# Patient Record
Sex: Female | Born: 2000 | Race: White | Hispanic: No | Marital: Single | Smoking: Never smoker
Health system: Southern US, Community
[De-identification: ages and names within clinical notes are randomized; demographics above are authoritative.]

## PROBLEM LIST (undated history)

## (undated) DIAGNOSIS — F419 Anxiety disorder, unspecified: Secondary | ICD-10-CM

## (undated) DIAGNOSIS — F909 Attention-deficit hyperactivity disorder, unspecified type: Secondary | ICD-10-CM

## (undated) HISTORY — PX: THYROID CYST EXCISION: SHX2511

## (undated) HISTORY — PX: NO PAST SURGERIES: SHX2092

## (undated) HISTORY — DX: Attention-deficit hyperactivity disorder, unspecified type: F90.9

---
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
## 2022-08-26 DIAGNOSIS — Z419 Encounter for procedure for purposes other than remedying health state, unspecified: Secondary | ICD-10-CM | POA: Diagnosis not present

## 2022-09-04 DIAGNOSIS — F411 Generalized anxiety disorder: Secondary | ICD-10-CM | POA: Diagnosis not present

## 2022-09-04 DIAGNOSIS — F418 Other specified anxiety disorders: Secondary | ICD-10-CM | POA: Diagnosis not present

## 2022-09-26 DIAGNOSIS — Z419 Encounter for procedure for purposes other than remedying health state, unspecified: Secondary | ICD-10-CM | POA: Diagnosis not present

## 2022-10-11 ENCOUNTER — Encounter (HOSPITAL_BASED_OUTPATIENT_CLINIC_OR_DEPARTMENT_OTHER): Payer: Self-pay | Admitting: Emergency Medicine

## 2022-10-11 ENCOUNTER — Emergency Department (HOSPITAL_BASED_OUTPATIENT_CLINIC_OR_DEPARTMENT_OTHER)
Admission: EM | Admit: 2022-10-11 | Discharge: 2022-10-11 | Disposition: A | Discharge status: Home / Self Care | Payer: Medicaid Other | Attending: Emergency Medicine | Admitting: Emergency Medicine

## 2022-10-11 ENCOUNTER — Other Ambulatory Visit: Payer: Self-pay

## 2022-10-11 DIAGNOSIS — R112 Nausea with vomiting, unspecified: Secondary | ICD-10-CM | POA: Diagnosis not present

## 2022-10-11 DIAGNOSIS — R197 Diarrhea, unspecified: Secondary | ICD-10-CM | POA: Insufficient documentation

## 2022-10-11 DIAGNOSIS — I951 Orthostatic hypotension: Secondary | ICD-10-CM | POA: Insufficient documentation

## 2022-10-11 LAB — CBC
HCT: 43.6 % (ref 36.0–46.0)
Hemoglobin: 15.2 g/dL — ABNORMAL HIGH (ref 12.0–15.0)
MCH: 31.1 pg (ref 26.0–34.0)
MCHC: 34.9 g/dL (ref 30.0–36.0)
MCV: 89.2 fL (ref 80.0–100.0)
Platelets: 228 10*3/uL (ref 150–400)
RBC: 4.89 MIL/uL (ref 3.87–5.11)
RDW: 12.5 % (ref 11.5–15.5)
WBC: 13.3 10*3/uL — ABNORMAL HIGH (ref 4.0–10.5)
nRBC: 0 % (ref 0.0–0.2)

## 2022-10-11 LAB — COMPREHENSIVE METABOLIC PANEL
ALT: 8 U/L (ref 0–44)
AST: 14 U/L — ABNORMAL LOW (ref 15–41)
Albumin: 5 g/dL (ref 3.5–5.0)
Alkaline Phosphatase: 50 U/L (ref 38–126)
Anion gap: 11 (ref 5–15)
BUN: 17 mg/dL (ref 6–20)
CO2: 25 mmol/L (ref 22–32)
Calcium: 9.9 mg/dL (ref 8.9–10.3)
Chloride: 102 mmol/L (ref 98–111)
Creatinine, Ser: 0.79 mg/dL (ref 0.44–1.00)
GFR, Estimated: >60 mL/min (ref >60)
Glucose, Bld: 92 mg/dL (ref 70–99)
Potassium: 3.8 mmol/L (ref 3.5–5.1)
Sodium: 138 mmol/L (ref 135–145)
Total Bilirubin: 1.4 mg/dL — ABNORMAL HIGH (ref 0.3–1.2)
Total Protein: 7.4 g/dL (ref 6.5–8.1)

## 2022-10-11 LAB — URINALYSIS, ROUTINE W REFLEX MICROSCOPIC
Bacteria, UA: NONE SEEN
Bilirubin Urine: NEGATIVE
Glucose, UA: NEGATIVE mg/dL
Hgb urine dipstick: NEGATIVE
Ketones, ur: NEGATIVE mg/dL
Leukocytes,Ua: NEGATIVE
Nitrite: NEGATIVE
Protein, ur: 30 mg/dL — AB
Specific Gravity, Urine: 1.034 — ABNORMAL HIGH (ref 1.005–1.030)
pH: 5.5 (ref 5.0–8.0)

## 2022-10-11 LAB — LIPASE, BLOOD: Lipase: 12 U/L (ref 11–51)

## 2022-10-11 LAB — PREGNANCY, URINE: Preg Test, Ur: NEGATIVE

## 2022-10-11 MED ORDER — LACTATED RINGERS IV BOLUS
1000.0000 mL | Freq: Once | INTRAVENOUS | Status: AC
  Administered 2022-10-11: 1000 mL via INTRAVENOUS

## 2022-10-11 MED ORDER — ONDANSETRON 4 MG PO TBDP: 4.0000 mg | ORAL_TABLET | Freq: Three times a day (TID) | ORAL | 0 refills | Status: DC | PRN

## 2022-10-11 MED ORDER — ONDANSETRON HCL 4 MG/2ML IJ SOLN
4.0000 mg | Freq: Once | INTRAMUSCULAR | Status: AC
  Administered 2022-10-11: 4 mg via INTRAVENOUS
  Filled 2022-10-11: qty 2

## 2022-10-11 NOTE — Discharge Instructions (Addendum)
Take the nausea medication as prescribed. Start with a clear liquid diet today and advance slowly as tolerated. Followup with your doctor.

## 2022-10-11 NOTE — ED Notes (Signed)
During orthostatics Pt was getting dizzy and lightheaded while standing.Marland KitchenMarland Kitchen

## 2022-10-11 NOTE — ED Notes (Signed)
No N/V while drinking fluids... Provider notified.

## 2022-10-11 NOTE — ED Provider Notes (Signed)
[facility] Provider Note   CSN: [id number] Arrival date & time: 10/11/22  [id number]     History  Chief Complaint  Patient presents with   Emesis   Diarrhea    Kathryn Bailey is a 22 y.o. female.  Patient with a history of POTS, anxiety, ADHD presenting with vomiting and diarrhea that woke her from sleep at 3 AM.  Believes she has food poisoning.  States she has had multiple episodes of nausea and vomiting as well as diarrhea.  Denies any black or bloody stools or blood in her emesis.  States last normal bowel movement was yesterday.  No fevers.  No travel or sick contacts.  No one else with similar symptoms. Patient states she has passed out 3-4 times today in the setting of her vomiting and diarrhea.  She does have a history of POTS.  Describes feeling lightheaded, nauseous, dizzy with blurry vision while sitting on the toilet.  She did fall to the ground of the toilet twice.  Again passed out while lying on the floor and passed out while lying in her bed.  Denies hitting her head or any injuries from passing out. Denies head, neck, back, chest or abdominal pain.  No pain with urination or blood in the urine.  Denies any possibility of pregnancy.  Still has appendix and gallbladder.  The history is provided by the patient.  Emesis Associated symptoms: diarrhea   Associated symptoms: no abdominal pain, no arthralgias, no cough, no fever, no headaches and no myalgias   Diarrhea Associated symptoms: vomiting   Associated symptoms: no abdominal pain, no arthralgias, no fever, no headaches and no myalgias        Home Medications Prior to Admission medications   Medication Sig Start Date End Date Taking? Authorizing Provider  buPROPion (WELLBUTRIN XL) 150 MG 24 hr tablet  07/17/18   [provider]  methylphenidate 54 MG PO CR tablet Take 54 mg by mouth every morning.    [provider]  sertraline (ZOLOFT) 50 MG tablet Take 50  mg by mouth 2 (two) times daily.     [provider]      Allergies    Patient has no known allergies.    Review of Systems   Review of Systems  Constitutional:  Positive for activity change and appetite change. Negative for fever.  HENT:  Negative for congestion.   Respiratory:  Negative for cough, chest tightness and shortness of breath.   Gastrointestinal:  Positive for diarrhea, nausea and vomiting. Negative for abdominal pain.  Genitourinary:  Negative for dysuria and hematuria.  Musculoskeletal:  Negative for arthralgias and myalgias.  Skin:  Negative for rash.  Neurological:  Positive for dizziness, syncope, weakness and light-headedness. Negative for headaches.   all other systems are negative except as noted in the HPI and PMH.    Physical Exam Updated Vital Signs BP 137/76   Pulse 93   Temp 98.1 F (36.7 C) (Oral)   Resp 14   Wt 56.7 kg   SpO2 96%  Physical Exam Vitals and nursing note reviewed.  Constitutional:      General: She is not in acute distress.    Appearance: She is well-developed.  HENT:     Head: Normocephalic and atraumatic.     Mouth/Throat:     Pharynx: No oropharyngeal exudate.  Eyes:     Conjunctiva/sclera: Conjunctivae normal.     Pupils: Pupils are equal, round, and reactive  to light.  Neck:     Comments: No meningismus. Cardiovascular:     Rate and Rhythm: Normal rate and regular rhythm.     Heart sounds: Normal heart sounds. No murmur heard. Pulmonary:     Effort: Pulmonary effort is normal. No respiratory distress.     Breath sounds: Normal breath sounds.  Abdominal:     Palpations: Abdomen is soft.     Tenderness: There is no abdominal tenderness. There is no guarding or rebound.  Musculoskeletal:        General: No tenderness. Normal range of motion.     Cervical back: Normal range of motion and neck supple.  Skin:    General: Skin is warm.  Neurological:     Mental Status: She is alert and oriented to person, place,  and time.     Cranial Nerves: No cranial nerve deficit.     Motor: No abnormal muscle tone.     Coordination: Coordination normal.     Comments:  5/5 strength throughout. CN 2-12 intact.Equal grip strength.   Psychiatric:        Behavior: Behavior normal.     ED Results / Procedures / Treatments   Labs (all labs ordered are listed, but only abnormal results are displayed) Labs Reviewed  COMPREHENSIVE METABOLIC PANEL - Abnormal; Notable for the following components:      Result Value   AST 14 (*)    Total Bilirubin 1.4 (*)    All other components within normal limits  CBC - Abnormal; Notable for the following components:   WBC 13.3 (*)    Hemoglobin 15.2 (*)    All other components within normal limits  URINALYSIS, ROUTINE W REFLEX MICROSCOPIC - Abnormal; Notable for the following components:   Specific Gravity, Urine 1.034 (*)    Protein, ur 30 (*)    All other components within normal limits  LIPASE, BLOOD  PREGNANCY, URINE    EKG EKG Interpretation  Date/Time:  Friday October 11 2022 06:21:00 EST Ventricular Rate:  103 PR Interval:  115 QRS Duration: 99 QT Interval:  335 QTC Calculation: 439 R Axis:   80 Text Interpretation: Sinus tachycardia RSR' in V1 or V2, right VCD or RVH No significant change was found Confirmed by Ezequiel Essex [phone]) on 10/11/2022 6:24:27 AM  Radiology No results found.  Procedures Procedures    Medications Ordered in ED Medications  lactated ringers bolus 1,000 mL (has no administration in time range)  ondansetron (ZOFRAN) injection 4 mg (has no administration in time range)    ED Course/ Medical Decision Making/ A&P                             Medical Decision Making Amount and/or Complexity of Data Reviewed Labs: ordered. Decision-making details documented in ED Course. Radiology: ordered and independent interpretation performed. Decision-making details documented in ED Course. ECG/medicine tests: ordered and  independent interpretation performed. Decision-making details documented in ED Course.  Risk Prescription drug management.   3 hours of nausea, vomiting, diarrhea this morning.  No abdominal pain.  Multiple syncopal episodes in setting of illness and history of POTS.  Denies any injury from the falls.  Abdomen soft without peritoneal signs.  Will hydrate, treat symptoms.  EKG shows sinus rhythm, no evidence of Brugada or prolonged QT.  Check labs, hCG, urinalysis, orthostatic vitals.  Orthostatics are positive. IVF given. Abdomen soft without peritoneal signs.  WBC elevated. No RLQ pain.  Doubt appendicitis or cholecystitis.  LFTs and lipase normal.  UA without ketones.   PO challenge and IVF pending at shift change.  Care transferred to Dr. Zenia Resides.          Final Clinical Impression(s) / ED Diagnoses Final diagnoses:  Nausea vomiting and diarrhea  Orthostasis    Rx / DC Orders ED Discharge Orders     None         Mialani Reicks, Annie Main, MD 10/11/22 [phone]

## 2022-10-11 NOTE — ED Provider Notes (Signed)
Patient signed to me by Dr Wyvonnia Dusky or pending results of IV fluids.  Patient reassessed and feels much better at this time.  We discharged home   Lacretia Leigh, MD 10/11/22 [phone]

## 2022-10-11 NOTE — ED Triage Notes (Signed)
Pt in with emesis/diarrhea since waking 3am. Denies any abdominal pain. Pt also reports she has passed out three times after vomiting episodes. Hx of POTS

## 2022-10-11 NOTE — ED Notes (Signed)
Initally Pt was going to leave without receiving additional fluids but after the orthostatics agreed to receive the fluids... Provide notified.Marland KitchenMarland Kitchen

## 2022-10-11 NOTE — ED Notes (Signed)
Discharge paperwork given and verbally understood. 

## 2022-10-11 NOTE — ED Notes (Signed)
Pt was able to ambulate without assistance, without feeling N/V or lightheaded/fainting.Marland KitchenMarland Kitchen

## 2022-10-25 DIAGNOSIS — Z419 Encounter for procedure for purposes other than remedying health state, unspecified: Secondary | ICD-10-CM | POA: Diagnosis not present

## 2022-11-19 DIAGNOSIS — N390 Urinary tract infection, site not specified: Secondary | ICD-10-CM | POA: Diagnosis not present

## 2022-11-25 DIAGNOSIS — Z419 Encounter for procedure for purposes other than remedying health state, unspecified: Secondary | ICD-10-CM | POA: Diagnosis not present

## 2022-12-25 DIAGNOSIS — Z419 Encounter for procedure for purposes other than remedying health state, unspecified: Secondary | ICD-10-CM | POA: Diagnosis not present

## 2023-01-25 DIAGNOSIS — Z419 Encounter for procedure for purposes other than remedying health state, unspecified: Secondary | ICD-10-CM | POA: Diagnosis not present

## 2023-02-24 DIAGNOSIS — Z419 Encounter for procedure for purposes other than remedying health state, unspecified: Secondary | ICD-10-CM | POA: Diagnosis not present

## 2023-03-27 DIAGNOSIS — Z419 Encounter for procedure for purposes other than remedying health state, unspecified: Secondary | ICD-10-CM | POA: Diagnosis not present

## 2023-04-27 DIAGNOSIS — Z419 Encounter for procedure for purposes other than remedying health state, unspecified: Secondary | ICD-10-CM | POA: Diagnosis not present

## 2023-05-27 DIAGNOSIS — Z419 Encounter for procedure for purposes other than remedying health state, unspecified: Secondary | ICD-10-CM | POA: Diagnosis not present

## 2023-06-27 DIAGNOSIS — Z419 Encounter for procedure for purposes other than remedying health state, unspecified: Secondary | ICD-10-CM | POA: Diagnosis not present

## 2023-07-27 DIAGNOSIS — Z419 Encounter for procedure for purposes other than remedying health state, unspecified: Secondary | ICD-10-CM | POA: Diagnosis not present

## 2023-08-23 DIAGNOSIS — M25561 Pain in right knee: Secondary | ICD-10-CM | POA: Diagnosis not present

## 2023-08-27 DIAGNOSIS — Z419 Encounter for procedure for purposes other than remedying health state, unspecified: Secondary | ICD-10-CM | POA: Diagnosis not present

## 2023-09-10 DIAGNOSIS — M25561 Pain in right knee: Secondary | ICD-10-CM | POA: Diagnosis not present

## 2023-09-23 DIAGNOSIS — M25561 Pain in right knee: Secondary | ICD-10-CM | POA: Diagnosis not present

## 2024-06-09 ENCOUNTER — Ambulatory Visit

## 2024-06-09 DIAGNOSIS — Z23 Encounter for immunization: Secondary | ICD-10-CM

## 2024-08-23 ENCOUNTER — Telehealth

## 2024-08-23 DIAGNOSIS — J029 Acute pharyngitis, unspecified: Secondary | ICD-10-CM | POA: Diagnosis not present

## 2024-08-23 DIAGNOSIS — R6889 Other general symptoms and signs: Secondary | ICD-10-CM | POA: Diagnosis not present

## 2024-08-23 DIAGNOSIS — R051 Acute cough: Secondary | ICD-10-CM

## 2024-08-23 DIAGNOSIS — R110 Nausea: Secondary | ICD-10-CM | POA: Diagnosis not present

## 2024-08-23 DIAGNOSIS — M791 Myalgia, unspecified site: Secondary | ICD-10-CM

## 2024-08-23 MED ORDER — OSELTAMIVIR PHOSPHATE 75 MG PO CAPS: 75.0000 mg | ORAL_CAPSULE | Freq: Two times a day (BID) | ORAL | 0 refills | Status: AC

## 2024-08-23 MED ORDER — PROMETHAZINE-DM 6.25-15 MG/5ML PO SYRP: 5.0000 mL | ORAL_SOLUTION | Freq: Four times a day (QID) | ORAL | 0 refills | Status: AC | PRN

## 2024-08-23 MED ORDER — ONDANSETRON 4 MG PO TBDP: 4.0000 mg | ORAL_TABLET | Freq: Three times a day (TID) | ORAL | 0 refills | Status: AC | PRN

## 2024-08-23 NOTE — Progress Notes (Signed)
 " Virtual Visit Consent   Kathryn Bailey, you are scheduled for a virtual visit with a [facility] provider today. Just as with appointments in the office, your consent must be obtained to participate. Your consent will be active for this visit and any virtual visit you may have with one of our providers in the next 365 days. If you have a MyChart account, a copy of this consent can be sent to you electronically.  As this is a virtual visit, video technology does not allow for your provider to perform a traditional examination. This may limit your provider's ability to fully assess your condition. If your provider identifies any concerns that need to be evaluated in person or the need to arrange testing (such as labs, EKG, etc.), we will make arrangements to do so. Although advances in technology are sophisticated, we cannot ensure that it will always work on either your end or our end. If the connection with a video visit is poor, the visit may have to be switched to a telephone visit. With either a video or telephone visit, we are not always able to ensure that we have a secure connection.  By engaging in this virtual visit, you consent to the provision of healthcare and authorize for your insurance to be billed (if applicable) for the services provided during this visit. Depending on your insurance coverage, you may receive a charge related to this service.  I need to obtain your verbal consent now. Are you willing to proceed with your visit today? Kathryn Bailey has provided verbal consent on 08/23/2024 for a virtual visit (video or telephone). Delon CHRISTELLA Dickinson, PA-C  Date: 08/23/2024 1:57 PM   Virtual Visit via Video Note   I, Delon CHRISTELLA Dickinson, connected with  Kathryn Bailey  ([id number], 01/12/2001) on 08/23/2024 at  1:30 PM EST by a video-enabled telemedicine application and verified that I am speaking with the correct person using two identifiers.  Location: Patient: Virtual Visit Location  Patient: Home Provider: Virtual Visit Location Provider: Home Office   I discussed the limitations of evaluation and management by telemedicine and the availability of in person appointments. The patient expressed understanding and agreed to proceed.    History of Present Illness: Kathryn Bailey is a 23 y.o. who identifies as a female who was assigned female at birth, and is being seen today for flu-like symptoms.  HPI: URI  This is a new problem. The current episode started in the past 7 days (Went to a wedding on Saturday, Symptoms worsened yesterday with flu-like symptoms). The problem has been gradually worsening. Maximum temperature: subjective fevers. Associated symptoms include congestion, coughing (productive yesterday and dry today), headaches, nausea, a plugged ear sensation, rhinorrhea, a sore throat and vomiting (Saturday night). Pertinent negatives include no chest pain, diarrhea, ear pain, sinus pain or wheezing. Associated symptoms comments: Body aches, hoarse voice. She has tried increased fluids and acetaminophen (mucinex, tylenol) for the symptoms. The treatment provided no relief.     Problems: There are no active problems to display for this patient.   Allergies: Allergies[1] Medications: Current Medications[2]  Observations/Objective: Patient is well-developed, well-nourished in no acute distress.  Resting comfortably at home.  Head is normocephalic, atraumatic.  No labored breathing.  Speech is clear and coherent with logical content.  Patient is alert and oriented at baseline.    Assessment and Plan: 1. Myalgia (Primary) - oseltamivir (TAMIFLU) 75 MG capsule; Take 1 capsule (75 mg total) by mouth 2 (  two) times daily.  Dispense: 10 capsule; Refill: 0  2. Sore throat - oseltamivir (TAMIFLU) 75 MG capsule; Take 1 capsule (75 mg total) by mouth 2 (two) times daily.  Dispense: 10 capsule; Refill: 0  3. Flu-like symptoms - oseltamivir (TAMIFLU) 75 MG capsule; Take 1  capsule (75 mg total) by mouth 2 (two) times daily.  Dispense: 10 capsule; Refill: 0 - promethazine-dextromethorphan (PROMETHAZINE-DM) 6.25-15 MG/5ML syrup; Take 5 mLs by mouth 4 (four) times daily as needed.  Dispense: 118 mL; Refill: 0  4. Nausea - oseltamivir (TAMIFLU) 75 MG capsule; Take 1 capsule (75 mg total) by mouth 2 (two) times daily.  Dispense: 10 capsule; Refill: 0 - ondansetron  (ZOFRAN -ODT) 4 MG disintegrating tablet; Take 1 tablet (4 mg total) by mouth every 8 (eight) hours as needed.  Dispense: 20 tablet; Refill: 0  5. Acute cough - promethazine-dextromethorphan (PROMETHAZINE-DM) 6.25-15 MG/5ML syrup; Take 5 mLs by mouth 4 (four) times daily as needed.  Dispense: 118 mL; Refill: 0  - Suspect influenza due to symptoms and positive exposure - Tamiflu prescribed - Promethazine DM for cough - Zofran  for nausea - Continue OTC medication of choice for symptomatic management - Push fluids - Rest - Seek in person evaluation if symptoms worsen or fail to improve   Follow Up Instructions: I discussed the assessment and treatment plan with the patient. The patient was provided an opportunity to ask questions and all were answered. The patient agreed with the plan and demonstrated an understanding of the instructions.  A copy of instructions were sent to the patient via MyChart unless otherwise noted below.    The patient was advised to call back or seek an in-person evaluation if the symptoms worsen or if the condition fails to improve as anticipated.    Delon HERO Demaurion Dicioccio, PA-C     [1] No Known Allergies [2]  Current Outpatient Medications:    ondansetron  (ZOFRAN -ODT) 4 MG disintegrating tablet, Take 1 tablet (4 mg total) by mouth every 8 (eight) hours as needed., Disp: 20 tablet, Rfl: 0   oseltamivir (TAMIFLU) 75 MG capsule, Take 1 capsule (75 mg total) by mouth 2 (two) times daily., Disp: 10 capsule, Rfl: 0   promethazine-dextromethorphan (PROMETHAZINE-DM) 6.25-15 MG/5ML  syrup, Take 5 mLs by mouth 4 (four) times daily as needed., Disp: 118 mL, Rfl: 0   buPROPion (WELLBUTRIN XL) 150 MG 24 hr tablet, , Disp: , Rfl:    methylphenidate 54 MG PO CR tablet, Take 54 mg by mouth every morning., Disp: , Rfl:    sertraline (ZOLOFT) 50 MG tablet, Take 50 mg by mouth 2 (two) times daily. , Disp: , Rfl:   "

## 2024-08-23 NOTE — Patient Instructions (Signed)
 " Kathryn Bailey, thank you for joining Delon CHRISTELLA Dickinson, PA-C for today's virtual visit.  While this provider is not your primary care provider (PCP), if your PCP is located in our provider database this encounter information will be shared with them immediately following your visit.   [facility] account gives you access to today's visit and all your visits, tests, and labs performed at [facility]  click here if you don't have a [facility] MyChart account or go to [email or web address]  Consent: (Patient) Kathryn Bailey provided verbal consent for this virtual visit at the beginning of the encounter.  Current Medications:  Current Outpatient Medications:    ondansetron  (ZOFRAN -ODT) 4 MG disintegrating tablet, Take 1 tablet (4 mg total) by mouth every 8 (eight) hours as needed., Disp: 20 tablet, Rfl: 0   oseltamivir (TAMIFLU) 75 MG capsule, Take 1 capsule (75 mg total) by mouth 2 (two) times daily., Disp: 10 capsule, Rfl: 0   promethazine-dextromethorphan (PROMETHAZINE-DM) 6.25-15 MG/5ML syrup, Take 5 mLs by mouth 4 (four) times daily as needed., Disp: 118 mL, Rfl: 0   buPROPion (WELLBUTRIN XL) 150 MG 24 hr tablet, , Disp: , Rfl:    methylphenidate 54 MG PO CR tablet, Take 54 mg by mouth every morning., Disp: , Rfl:    sertraline (ZOLOFT) 50 MG tablet, Take 50 mg by mouth 2 (two) times daily. , Disp: , Rfl:    Medications ordered in this encounter:  Meds ordered this encounter  Medications   oseltamivir (TAMIFLU) 75 MG capsule    Sig: Take 1 capsule (75 mg total) by mouth 2 (two) times daily.    Dispense:  10 capsule    Refill:  0    Supervising Provider:   LAMPTEY, PHILIP O [[id number]]   ondansetron  (ZOFRAN -ODT) 4 MG disintegrating tablet    Sig: Take 1 tablet (4 mg total) by mouth every 8 (eight) hours as needed.    Dispense:  20 tablet    Refill:  0    Supervising Provider:   LAMPTEY, PHILIP O [[id number]]   promethazine-dextromethorphan (PROMETHAZINE-DM)  6.25-15 MG/5ML syrup    Sig: Take 5 mLs by mouth 4 (four) times daily as needed.    Dispense:  118 mL    Refill:  0    Supervising Provider:   BLAISE ALEENE KIDD [[id number]]     *If you need refills on other medications prior to your next appointment, please contact your pharmacy*  Follow-Up: Call back or seek an in-person evaluation if the symptoms worsen or if the condition fails to improve as anticipated.  [facility] [phone]  Other Instructions Influenza, Adult Influenza is also called the flu. It's an infection that affects your respiratory tract. This includes your nose, throat, windpipe, and lungs. The flu is contagious. This means it spreads easily from person to person. It causes symptoms that are like a cold. It can also cause a high fever and body aches. What are the causes? The flu is caused by the influenza virus. You can get it by: Breathing in droplets that are in the air after an infected person coughs or sneezes. Touching something that has the virus on it and then touching your mouth, nose, or eyes. What increases the risk? You may be more likely to get the flu if: You don't wash your hands often. You're near a lot of people during cold and flu season. You touch your mouth, eyes, or nose without washing your hands first.  You don't get a flu shot each year. You may also be more at risk for the flu and serious problems, such as a lung infection called pneumonia, if: You're older than 65. You're pregnant. Your immune system is weak. Your immune system is your body's defense system. You have a long-term, or chronic, condition, such as: Heart, kidney, or lung disease. Diabetes. A liver disorder. Asthma. You're very overweight. You have anemia. This is when you don't have enough red blood cells in your body. What are the signs or symptoms? Flu symptoms often start all of a sudden. They may last 4-14 days and include: Fever and chills. Headaches,  body aches, or muscle aches. Sore throat. Cough. Runny or stuffy nose. Discomfort in your chest. Not wanting to eat as much as normal. Feeling weak or tired. Feeling dizzy. Nausea or vomiting. How is this diagnosed? The flu may be diagnosed based on your symptoms and medical history. You may also have a physical exam. A swab may be taken from your nose or throat and tested for the virus. How is this treated? If the flu is found early, you can be treated with antiviral medicine. This may be given to you by mouth or through an IV. It can help you feel less sick and get better faster. Taking care of yourself at home can also help your symptoms get better. Your health care provider may tell you to: Take over-the-counter medicines. Drink lots of fluids. The flu often goes away on its own. If you have very bad symptoms or problems caused by the flu, you may need to be treated in a hospital. Follow these instructions at home: Activity Rest as needed. Get lots of sleep. Stay home from work or school as told by your provider. Leave home only to go see your provider. Do not leave home for other reasons until you don't have a fever for 24 hours without taking medicine. Eating and drinking Take an oral rehydration solution (ORS). This is a drink that is sold at pharmacies and stores. Drink enough fluid to keep your pee pale yellow. Try to drink small amounts of clear fluids. These include water, ice chips, fruit juice mixed with water, and low-calorie sports drinks. Try to eat bland foods that are easy to digest. These include bananas, applesauce, rice, lean meats, toast, and crackers. Avoid drinks that have a lot of sugar or caffeine in them. These include energy drinks, regular sports drinks, and soda. Do not drink alcohol. Do not eat spicy or fatty foods. General instructions     Take your medicines only as told by your provider. Use a cool mist humidifier to add moisture to the air in  your home. This can make it easier for you to breathe. You should also clean the humidifier every day. To do so: Empty the water. Pour clean water in. Cover your mouth and nose when you cough or sneeze. Wash your hands with soap and water often and for at least 20 seconds. It's extra important to do so after you cough or sneeze. If you can't use soap and water, use hand sanitizer. How is this prevented?  Get a flu shot every year. Ask your provider when you should get your flu shot. Stay away from people who are sick during fall and winter. Fall and winter are cold and flu season. Contact a health care provider if: You get new symptoms. You have chest pain. You have watery poop, also called diarrhea. You have a  fever. Your cough gets worse. You start to have more mucus. You feel like you may vomit, or you vomit. Get help right away if: You become short of breath or have trouble breathing. Your skin or nails turn blue. You have very bad pain or stiffness in your neck. You get a sudden headache or pain in your face or ear. You vomit each time you eat or drink. These symptoms may be an emergency. Call 911 right away. Do not wait to see if the symptoms will go away. Do not drive yourself to the hospital. This information is not intended to replace advice given to you by your health care provider. Make sure you discuss any questions you have with your health care provider. Document Revised: 05/15/2023 Document Reviewed: 09/19/2022 Elsevier Patient Education  2024 [employer]   If you have been instructed to have an in-person evaluation today at a local Urgent Care facility, please use the link below. It will take you to a list of all of our available [facility], including address, phone number and hours of operation. Please do not delay care.  [facility]  If you or a family member do not have a primary care provider, use the link below to schedule a visit  and establish care. When you choose a [facility] primary care physician or advanced practice provider, you gain a long-term partner in health. Find a Primary Care Provider  Learn more about [facility] in-office and virtual care options: [facility] - Get Care Now "
